# Patient Record
Sex: Female | Born: 1965 | Race: White | Hispanic: No | Smoking: Current every day smoker
Health system: Southern US, Community
[De-identification: ages and names within clinical notes are randomized; demographics above are authoritative.]

## PROBLEM LIST (undated history)

## (undated) DIAGNOSIS — F329 Major depressive disorder, single episode, unspecified: Secondary | ICD-10-CM

## (undated) DIAGNOSIS — J45909 Unspecified asthma, uncomplicated: Secondary | ICD-10-CM

## (undated) DIAGNOSIS — F32A Depression, unspecified: Secondary | ICD-10-CM

## (undated) DIAGNOSIS — F4310 Post-traumatic stress disorder, unspecified: Secondary | ICD-10-CM

## (undated) DIAGNOSIS — F419 Anxiety disorder, unspecified: Secondary | ICD-10-CM

## (undated) DIAGNOSIS — L409 Psoriasis, unspecified: Secondary | ICD-10-CM

## (undated) HISTORY — DX: Psoriasis, unspecified: L40.9

## (undated) HISTORY — PX: CHOLECYSTECTOMY: SHX55

## (undated) HISTORY — DX: Unspecified asthma, uncomplicated: J45.909

## (undated) HISTORY — DX: Depression, unspecified: F32.A

## (undated) HISTORY — DX: Major depressive disorder, single episode, unspecified: F32.9

## (undated) HISTORY — DX: Post-traumatic stress disorder, unspecified: F43.10

## (undated) HISTORY — DX: Anxiety disorder, unspecified: F41.9

---

## 2017-10-23 ENCOUNTER — Other Ambulatory Visit: Payer: Self-pay | Admitting: Physician Assistant

## 2017-10-23 DIAGNOSIS — M545 Low back pain: Secondary | ICD-10-CM

## 2017-10-28 ENCOUNTER — Ambulatory Visit
Admission: RE | Admit: 2017-10-28 | Discharge: 2017-10-28 | Disposition: A | Discharge status: Home / Self Care | Payer: BLUE CROSS/BLUE SHIELD | Source: Ambulatory Visit | Attending: Physician Assistant | Admitting: Physician Assistant

## 2017-10-28 DIAGNOSIS — M545 Low back pain: Secondary | ICD-10-CM

## 2018-03-28 ENCOUNTER — Ambulatory Visit (INDEPENDENT_AMBULATORY_CARE_PROVIDER_SITE_OTHER): Payer: Self-pay | Admitting: Obstetrics & Gynecology

## 2018-03-28 ENCOUNTER — Encounter: Payer: Self-pay | Admitting: Obstetrics & Gynecology

## 2018-03-28 ENCOUNTER — Other Ambulatory Visit (HOSPITAL_COMMUNITY)
Admission: RE | Admit: 2018-03-28 | Discharge: 2018-03-28 | Disposition: A | Discharge status: Home / Self Care | Payer: Self-pay | Source: Ambulatory Visit | Attending: Obstetrics & Gynecology | Admitting: Obstetrics & Gynecology

## 2018-03-28 ENCOUNTER — Telehealth: Payer: Self-pay | Admitting: Clinical

## 2018-03-28 DIAGNOSIS — R87810 Cervical high risk human papillomavirus (HPV) DNA test positive: Secondary | ICD-10-CM | POA: Insufficient documentation

## 2018-03-28 DIAGNOSIS — R87610 Atypical squamous cells of undetermined significance on cytologic smear of cervix (ASC-US): Secondary | ICD-10-CM | POA: Insufficient documentation

## 2018-03-28 DIAGNOSIS — N870 Mild cervical dysplasia: Secondary | ICD-10-CM

## 2018-03-28 LAB — POCT PREGNANCY, URINE: PREG TEST UR: NEGATIVE

## 2018-03-28 NOTE — Telephone Encounter (Signed)
Follow up call after positive PHQ9/GAD7 today. Pt says she has a therapist she sees for three hours per week. Pt denies SI, "I never wanted to hurt myself". Pt attributes positive scores to current life stress and PTSD stemming from ongoing childhood trauma. Pt says "Thank you so much for calling to check on me". Pt agrees she will continue to see her therapist on a routine basis.   Depression screen PHQ 2/9 03/28/2018  Decreased Interest 3  Down, Depressed, Hopeless 3  PHQ - 2 Score 6  Altered sleeping 3  Tired, decreased energy 3  Change in appetite 3  Feeling bad or failure about yourself  3  Trouble concentrating 3  Moving slowly or fidgety/restless 3  Suicidal thoughts 1  PHQ-9 Score 25   GAD 7 : Generalized Anxiety Score 03/28/2018  Nervous, Anxious, on Edge 3  Control/stop worrying 3  Worry too much - different things 3  Trouble relaxing 3  Restless 3  Easily annoyed or irritable 3  Afraid - awful might happen 3  Total GAD 7 Score 21

## 2018-03-28 NOTE — Progress Notes (Signed)
Patient ID: Julia Pittman, female   DOB: 1966-01-25, 53 y.o.   MRN: [id number]  Chief Complaint  Patient presents with  . Colposcopy    HPI Garland Clendenen is a 53 y.o. female.  [id number] Postmenopausal for 2 year HPI  Indications: Pap smear on November 2019 showed: ASCUS with POSITIVE high risk HPV. Previous colposcopy: unsure. Prior cervical treatment: no treatment.  Past Medical History:  Diagnosis Date  . Anxiety   . Asthma   . Depression   . Psoriasis   . PTSD (post-traumatic stress disorder)     Past Surgical History:  Procedure Laterality Date  . CHOLECYSTECTOMY      History reviewed. No pertinent family history.  Social History Social History   Tobacco Use  . Smoking status: Current Every Day Smoker    Types: Cigarettes  . Smokeless tobacco: Never Used  . Tobacco comment: 5 a day  Substance Use Topics  . Alcohol use: Not on file  . Drug use: Not on file    Not on File  Current Outpatient Medications  Medication Sig Dispense Refill  . CYCLOBENZAPRINE HCL PO Take 5 mg by mouth.    . diazepam (VALIUM) 5 MG tablet Take 5 mg by mouth every 6 (six) hours as needed for anxiety.    . diclofenac (VOLTAREN) 75 MG EC tablet Take 75 mg by mouth 2 (two) times daily.    . finasteride (PROSCAR) 5 MG tablet Take 5 mg by mouth daily.    . Fluocinolone Acetonide 0.01 % OIL Place in ear(s).    Marland Kitchen FLUoxetine (PROZAC) 40 MG capsule Take 40 mg by mouth daily.    Marland Kitchen gabapentin (NEURONTIN) 300 MG capsule Take 300 mg by mouth 3 (three) times daily.    . imiquimod (ALDARA) 5 % cream Apply topically 3 (three) times a week.    Marland Kitchen ketoconazole (NIZORAL) 2 % shampoo Apply 1 application topically 2 (two) times a week.    . mometasone (ELOCON) 0.1 % cream Apply 1 application topically daily.    Marland Kitchen terbinafine (LAMISIL) 250 MG tablet Take 250 mg by mouth daily.    . traMADol (ULTRAM) 50 MG tablet Take by mouth every 6 (six) hours as needed.     No current facility-administered medications  for this visit.     Review of Systems Review of Systems  Gastrointestinal: Positive for abdominal pain.  Genitourinary: Negative for menstrual problem, vaginal bleeding and vaginal discharge.    Blood pressure 130/80, pulse 76, weight 192 lb 14.4 oz (87.5 kg).  Physical Exam Physical Exam Constitutional:      Appearance: She is not ill-appearing.  Pulmonary:     Effort: Pulmonary effort is normal.  Abdominal:     General: There is no distension.  Genitourinary:    General: Normal vulva.     Vagina: No vaginal discharge.  Neurological:     Mental Status: She is alert.     Data Reviewed Pap result  Assessment    Procedure Details  The risks and benefits of the procedure and Written informed consent obtained.  Speculum placed in vagina and excellent visualization of cervix achieved, cervix swabbed x 3 with acetic acid solution. Cervix without gross lesion, minimal AWE at SCJ ant and post, no endocervical lesion,  Specimens: ECC and bx at 12 and 6, Monsel's applied  Complications: none.     Plan    Specimens labelled and sent to Pathology. Call to discuss Pathology results in 2 weeks. LSIL, repeat 12 month for  pap      Scheryl DarterJames  03/28/2018, 3:30 PM

## 2018-03-28 NOTE — Patient Instructions (Signed)
Colposcopy, Care After  This sheet gives you information about how to care for yourself after your procedure. Your doctor may also give you more specific instructions. If you have problems or questions, contact your doctor.  What can I expect after the procedure?  If you did not have a tissue sample removed (did not have a biopsy), you may only have some spotting for a few days. You can go back to your normal activities.  If you had a tissue sample removed, it is common to have:   Soreness and pain. This may last for a few days.   Light-headedness.   Mild bleeding from your vagina or dark-colored, grainy discharge from your vagina. This may last for a few days. You may need to wear a sanitary pad.   Spotting for at least 48 hours after the procedure.  Follow these instructions at home:     Take over-the-counter and prescription medicines only as told by your doctor. Ask your doctor what medicines you can start taking again. This is very important if you take blood-thinning medicine.   Do not drive or use heavy machinery while taking prescription pain medicine.   For 3 days, or as long as your doctor tells you, avoid:  ? Douching.  ? Using tampons.  ? Having sex.   If you use birth control (contraception), keep using it.   Limit activity for the first day after the procedure. Ask your doctor what activities are safe for you.   It is up to you to get the results of your procedure. Ask your doctor when your results will be ready.   Keep all follow-up visits as told by your doctor. This is important.  Contact a doctor if:   You get a skin rash.  Get help right away if:   You are bleeding a lot from your vagina. It is a lot of bleeding if you are using more than one pad an hour for 2 hours in a row.   You have clumps of blood (blood clots) coming from your vagina.   You have a fever.   You have chills   You have pain in your lower belly (pelvic area).   You have signs of infection, such as vaginal  discharge that is:  ? Different than usual.  ? Yellow.  ? Bad-smelling.   You have very pain or cramps in your lower belly that do not get better with medicine.   You feel light-headed.   You feel dizzy.   You pass out (faint).  Summary   If you did not have a tissue sample removed (did not have a biopsy), you may only have some spotting for a few days. You can go back to your normal activities.   If you had a tissue sample removed, it is common to have mild pain and spotting for 48 hours.   For 3 days, or as long as your doctor tells you, avoid douching, using tampons and having sex.   Get help right away if you have bleeding, very bad pain, or signs of infection.  This information is not intended to replace advice given to you by your health care provider. Make sure you discuss any questions you have with your health care provider.  Document Released: 07/19/2007 Document Revised: 10/20/2015 Document Reviewed: 10/20/2015  Elsevier Interactive Patient Education  2019 [employer]

## 2018-04-02 ENCOUNTER — Telehealth: Payer: Self-pay

## 2018-04-02 ENCOUNTER — Encounter: Payer: Self-pay | Admitting: *Deleted

## 2018-04-02 NOTE — Telephone Encounter (Signed)
Called pt to give test results for Colpo, pt states is still hurting, cramping. Is taking pain meds. Asked pt if still hurting by tomorrow to please come in Evening clinic between 4:30 -7:30p. Pt verbalized understanding.

## 2018-04-02 NOTE — Telephone Encounter (Signed)
-----   Message from Adam Phenix, MD sent at 04/01/2018  8:45 PM EST ----- LSIL pap, may repeat in 12 months

## 2019-07-24 DIAGNOSIS — R531 Weakness: Secondary | ICD-10-CM

## 2019-07-24 DIAGNOSIS — N179 Acute kidney failure, unspecified: Secondary | ICD-10-CM

## 2019-07-24 DIAGNOSIS — J9601 Acute respiratory failure with hypoxia: Secondary | ICD-10-CM

## 2019-07-30 DIAGNOSIS — R600 Localized edema: Secondary | ICD-10-CM

## 2019-10-01 ENCOUNTER — Encounter: Payer: Self-pay | Admitting: Sports Medicine

## 2019-10-01 ENCOUNTER — Other Ambulatory Visit: Payer: Self-pay

## 2019-10-01 ENCOUNTER — Ambulatory Visit: Payer: Medicaid Other | Admitting: Sports Medicine

## 2019-10-01 DIAGNOSIS — M2550 Pain in unspecified joint: Secondary | ICD-10-CM

## 2019-10-01 DIAGNOSIS — M792 Neuralgia and neuritis, unspecified: Secondary | ICD-10-CM | POA: Diagnosis not present

## 2019-10-01 DIAGNOSIS — I739 Peripheral vascular disease, unspecified: Secondary | ICD-10-CM | POA: Diagnosis not present

## 2019-10-01 DIAGNOSIS — M79671 Pain in right foot: Secondary | ICD-10-CM

## 2019-10-01 MED ORDER — PREDNISONE 10 MG (21) PO TBPK: ORAL_TABLET | ORAL | 0 refills | Status: DC

## 2019-10-01 MED ORDER — GABAPENTIN 300 MG PO CAPS: 300.0000 mg | ORAL_CAPSULE | Freq: Every day | ORAL | 1 refills | Status: DC

## 2019-10-01 NOTE — Progress Notes (Signed)
Subjective: Julia Pittman is a 54 y.o. female patient who presents to office for evaluation of spasming and numbness and tingling and swelling to both lower extremities reports that both equally hurts the same left slightly worse reports that she fell in the shower on June 10 and was hospitalized for a week and afterwards she still had pain in her legs and numbness to her toes especially the left middle toes as well as some pain to the lateral ankle that sharp shooting in nature.  Patient reports that she is also been experiencing swelling and soreness of the entire legs and shin.  Patient reports that self therapy in the water seems to help a little bit but otherwise no other relief with any other treatments.  Review of systems noncontributory.  Patient Active Problem List   Diagnosis Date Noted  . 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 CYCLOBENZAPRINE HCL PO Take 5 mg by mouth.    . diazepam (VALIUM) 5 MG tablet Take 5 mg by mouth every 6 (six) hours as needed for anxiety.    . diclofenac (VOLTAREN) 75 MG EC tablet Take 75 mg by mouth 2 (two) times daily.    . finasteride (PROSCAR) 5 MG tablet Take 5 mg by mouth daily.    . Fluocinolone Acetonide 0.01 % OIL Place in ear(s).    Marland Kitchen FLUoxetine (PROZAC) 40 MG capsule Take 40 mg by mouth daily.    . imiquimod (ALDARA) 5 % cream Apply topically 3 (three) times a week.    Marland Kitchen ketoconazole (NIZORAL) 2 % shampoo Apply 1 application topically 2 (two) times a week.    . mometasone (ELOCON) 0.1 % cream Apply 1 application topically daily.    Marland Kitchen terbinafine (LAMISIL) 250 MG tablet Take 250 mg by mouth daily.    . traMADol (ULTRAM) 50 MG tablet Take by mouth every 6 (six) hours as needed.     No current facility-administered medications on file prior to visit.    Not on File  Objective:  General: Alert and oriented x3 in no acute  distress  Dermatology: No open lesions bilateral lower extremities, no webspace macerations, no ecchymosis bilateral, all nails x 10 are well manicured.  Venous stasis hyperpigmentation noted bilateral.  Vascular: Dorsalis Pedis and Posterior Tibial pedal pulses palpable, Capillary Fill Time 5 seconds,(-) pedal hair growth bilateral, trace edema bilateral lower extremities, Temperature gradient within normal limits.  Neurology: Michaell Cowing sensation intact via light touch bilateral, subjective tingling and burning sensation to feet as well as to toes worse left middle toes 2 through 4  Musculoskeletal: Mild tenderness with palpation diffusely to both feet and legs entirely.  There is pain with range of motion bilateral and limited ankle joint dorsiflexion consistent with equinus.  Gait: Antalgic gait  Xrays reviewed from [facility] from 09/05/2019 suggestive of mild early bunion but no other acute findings.  Assessment and Plan: Problem List Items Addressed This Visit    None    Visit Diagnoses    PVD (peripheral vascular disease) (HCC)    -  Primary   Neuritis       Arthralgia, unspecified joint       Foot pain, bilateral           -Complete examination performed -Xrays reviewed from [facility] -Discussed treatement options likely PVD neuritis and arthralgia -Rx gabapentin and prednisone to take as instructed -Dispensed compression  Surgigrip sleeves to use for edema control bilateral -Recommend activities to tolerance and gentle home PT of water aerobics or stretching as tolerated -Patient to return to office in 1 month or sooner if condition worsens.  Advised patient if symptoms are still present may benefit from an arthritic panel.  Asencion Islam, DPM

## 2019-10-29 ENCOUNTER — Ambulatory Visit: Payer: Medicaid Other | Admitting: Sports Medicine

## 2019-11-07 ENCOUNTER — Encounter: Payer: Self-pay | Admitting: Sports Medicine

## 2019-11-07 ENCOUNTER — Other Ambulatory Visit: Payer: Self-pay

## 2019-11-07 ENCOUNTER — Ambulatory Visit: Payer: Medicaid Other | Admitting: Sports Medicine

## 2019-11-07 DIAGNOSIS — M2550 Pain in unspecified joint: Secondary | ICD-10-CM

## 2019-11-07 DIAGNOSIS — D72829 Elevated white blood cell count, unspecified: Secondary | ICD-10-CM | POA: Insufficient documentation

## 2019-11-07 DIAGNOSIS — M792 Neuralgia and neuritis, unspecified: Secondary | ICD-10-CM

## 2019-11-07 DIAGNOSIS — M6282 Rhabdomyolysis: Secondary | ICD-10-CM | POA: Insufficient documentation

## 2019-11-07 DIAGNOSIS — J309 Allergic rhinitis, unspecified: Secondary | ICD-10-CM | POA: Insufficient documentation

## 2019-11-07 DIAGNOSIS — R112 Nausea with vomiting, unspecified: Secondary | ICD-10-CM | POA: Insufficient documentation

## 2019-11-07 DIAGNOSIS — I739 Peripheral vascular disease, unspecified: Secondary | ICD-10-CM

## 2019-11-07 DIAGNOSIS — M79671 Pain in right foot: Secondary | ICD-10-CM

## 2019-11-07 MED ORDER — GABAPENTIN 300 MG PO CAPS: 300.0000 mg | ORAL_CAPSULE | Freq: Two times a day (BID) | ORAL | 3 refills | Status: AC

## 2019-11-07 NOTE — Progress Notes (Signed)
Subjective: Julia Pittman is a 54 y.o. female patient returns to office for follow-up evaluation of bilateral foot pain.  Patient reports that the swelling is not as bad since she has been using the compression sleeve that we provided and taking a fluid medication but she still has sharp shooting pains in both feet and legs through the toes and arch patient reports that she went to pain management and will be going back on September 29 for a shot in her back patient reports that the prednisone did not make a difference with her pain and the gabapentin is difficult to tell if it is helping still has significant pain worse at bedtime sharp shooting and tingling to both feet.  Patient reports that her pain doctor started her on hydrocodone as well and wanted to let me know.  Patient denies any other pedal complaints at this time.  Patient Active Problem List   Diagnosis Date Noted  . Allergic rhinitis 11/07/2019  . 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 Leukocytosis 11/07/2019  . Nausea and vomiting 11/07/2019  . 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 CYCLOBENZAPRINE HCL PO Take 5 mg by mouth.    . diazepam (VALIUM) 5 MG tablet Take 5 mg by mouth every 6 (six) hours as needed for anxiety.    . diclofenac (VOLTAREN) 75 MG EC tablet Take 75 mg by mouth 2 (two) times daily.    . finasteride (PROSCAR) 5 MG tablet Take 5 mg by mouth daily.    . Fluocinolone Acetonide 0.01 % OIL Place in ear(s).    Marland Kitchen FLUoxetine (PROZAC) 40 MG capsule Take 40 mg by mouth daily.    . imiquimod (ALDARA) 5 % cream Apply topically 3 (three) times a week.    Marland Kitchen ketoconazole (NIZORAL) 2 % shampoo Apply 1 application topically 2 (two) times a week.    . mometasone (ELOCON) 0.1 % cream Apply 1 application topically daily.    . predniSONE (STERAPRED UNI-PAK 21 TAB) 10 MG (21) TBPK tablet Take  as directed 21 tablet 0  . terbinafine (LAMISIL) 250 MG tablet Take 250 mg by mouth daily.    . traMADol (ULTRAM) 50 MG tablet Take by mouth every 6 (six) hours as needed.     No current facility-administered medications on file prior to visit.    Not on File  Objective:  General: Alert and oriented x3 in no acute distress  Dermatology: No open lesions bilateral lower extremities, no webspace macerations, no ecchymosis bilateral, all nails x 10 are well manicured.  Venous stasis hyperpigmentation noted bilateral.  Vascular: Dorsalis Pedis and Posterior Tibial pedal pulses palpable, Capillary Fill Time 5 seconds,(-) pedal hair growth bilateral, trace edema bilateral lower extremities, Temperature gradient within normal limits.  Neurology: Gross sensation intact via light touch bilateral, subjective tingling and burning sensation to feet as well as to toes worse left middle toes 2 through 4 and bilateral arches.  Musculoskeletal: Patient is hypersensitive to light touch bilateral with guarded range of motion due to increased sharp shooting pain bilateral.   Assessment and Plan: Problem List Items Addressed This Visit    None    Visit Diagnoses    Neuritis    -  Primary   PVD (peripheral vascular disease) (HCC)       Arthralgia, unspecified joint       Foot pain, bilateral           -  Complete examination performed -Rediscussed with patient treatment options for neuritis likely related to back issues -Discussed with patient likely need to increase gabapentin to twice daily however I will also discuss this change of medication with her pain management doctor patient sees Selena Batten at [facility] I have called and left a message at this phone number of [phone] informing Selena Batten of the changing gabapentin -Continue with compression Surgigrip sleeves to use for edema control bilateral -Recommend activities to tolerance and sketchers arch feet or vionic shoes to provide additional  foot support -Patient to return to office as scheduled if pain continues to worsen may benefit from an arthritic panel.  Asencion Islam, DPM

## 2020-03-14 IMAGING — MR MR LUMBAR SPINE W/O CM
4 of 5 series · 18 of 48 positions shown · non-contrast
Comparison: None.

CLINICAL DATA: Fall 2 weeks ago with bilateral low back pain

EXAM:
MRI LUMBAR SPINE WITHOUT CONTRAST
TECHNIQUE: Multiplanar, multisequence MR imaging of the lumbar spine was
performed. No intravenous contrast was administered.

[Series 6: T2 · sagittal · 4.0mm · 0.73mm/px · 6 of 15 slices shown (1 of 2)]
[im 1/15]
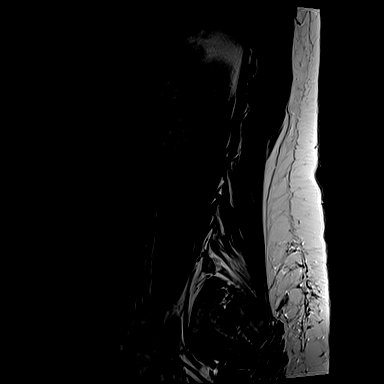
[im 3/15]
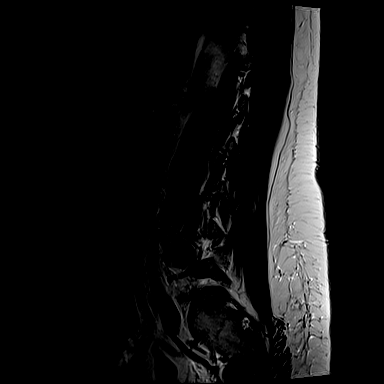
[im 6/15]
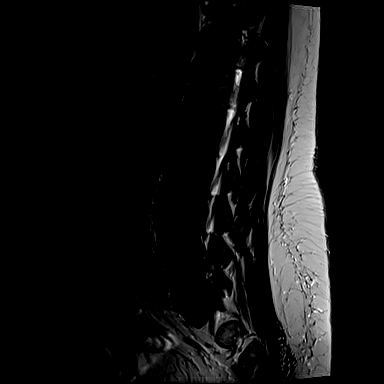
[im 9/15]
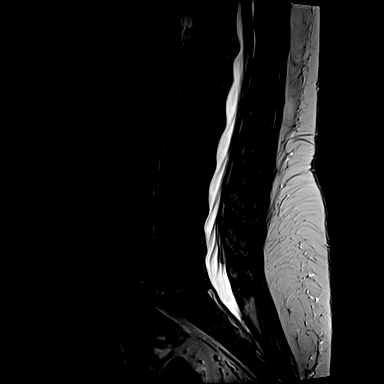
[im 12/15]
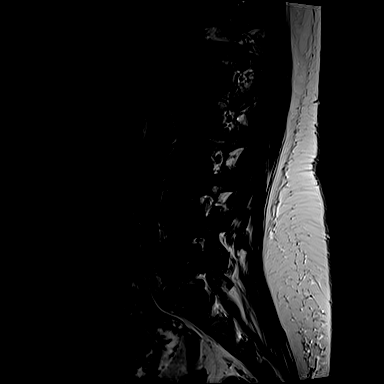
[im 15/15]
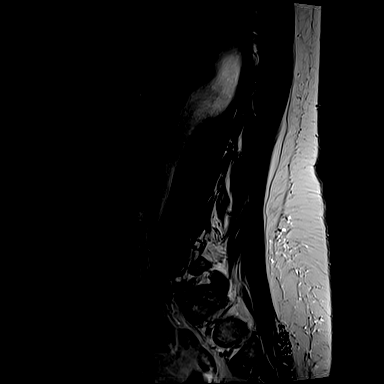

[Series 7: T1 · sagittal · 4.0mm · 0.73mm/px · 3 of 15 slices shown (1 of 2)]
[im 1/15]
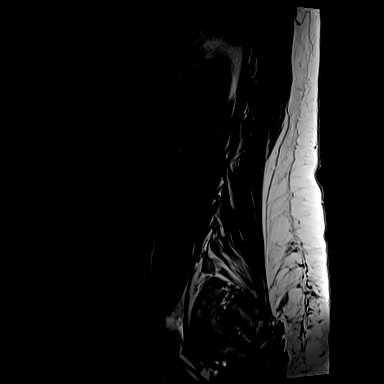
[im 8/15]
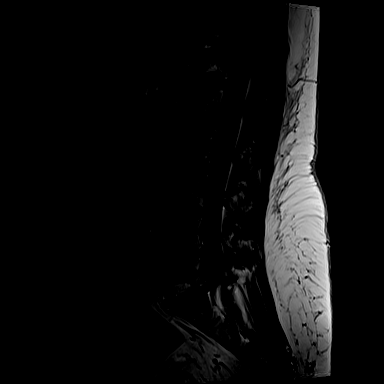
[im 15/15]
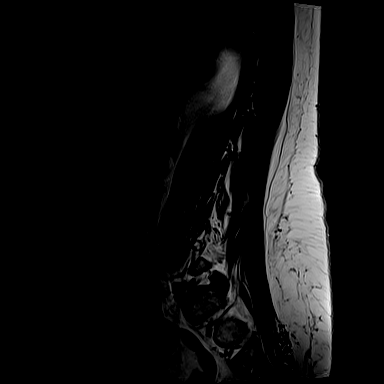

[Series 11: T1 · axial · 4.0mm · 0.28mm/px · z∈[-18,+153]mm · 3 of 43 slices shown (2 of 2)]
[im 6/43]
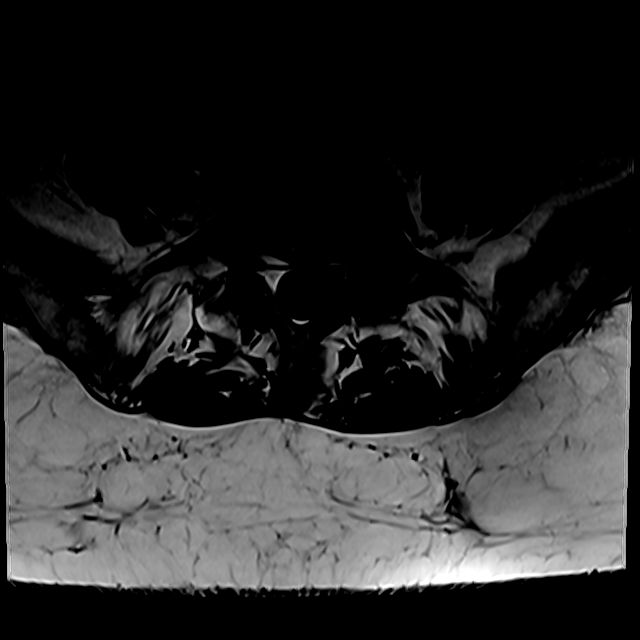
[im 23/43]
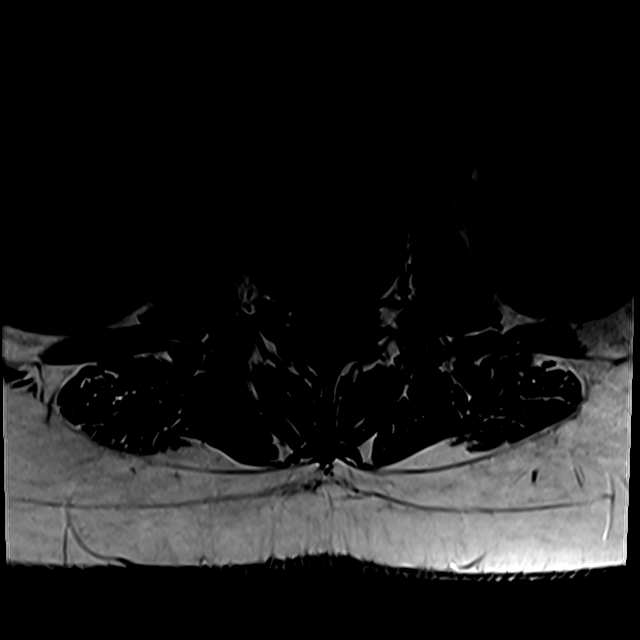
[im 37/43]
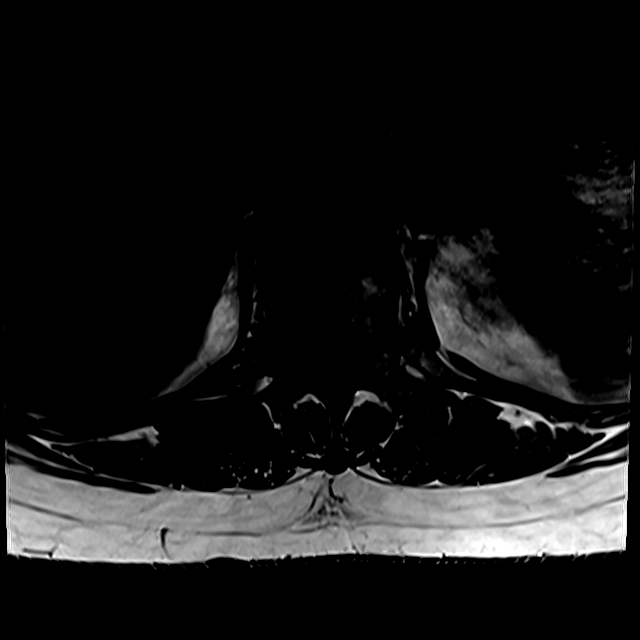

[Series 14: T2 · axial · 4.0mm · 0.28mm/px · z∈[-33,+153]mm · 6 of 43 slices shown (2 of 2)]
[im 3/43]
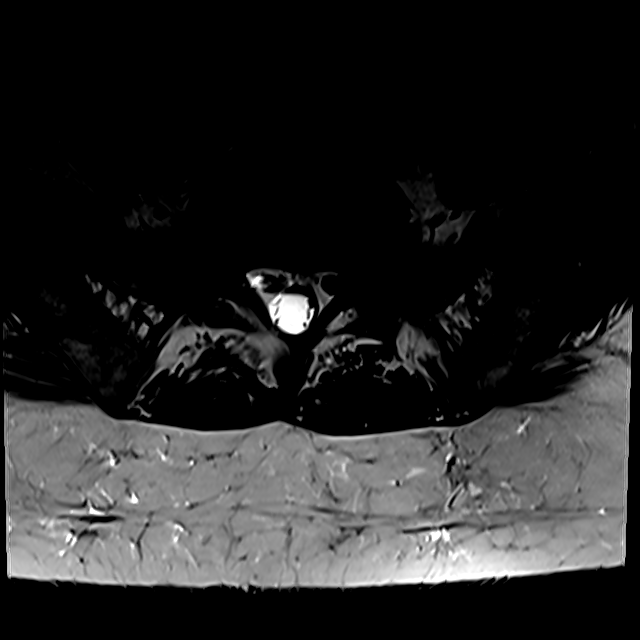
[im 6/43]
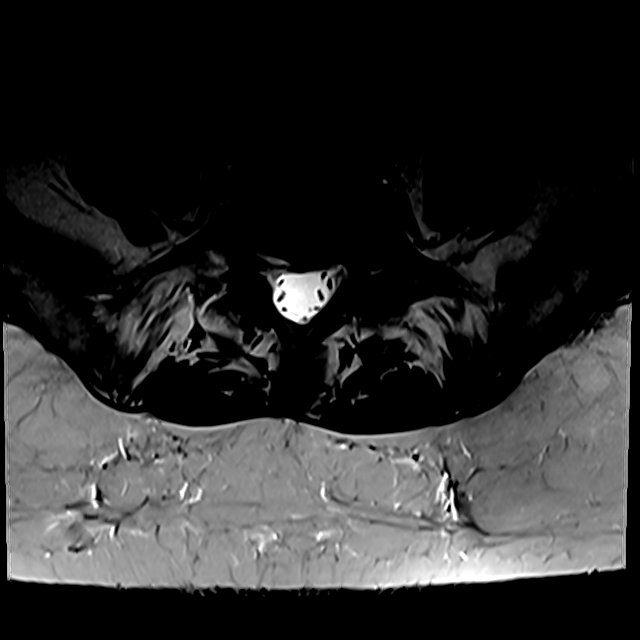
[im 9/43]
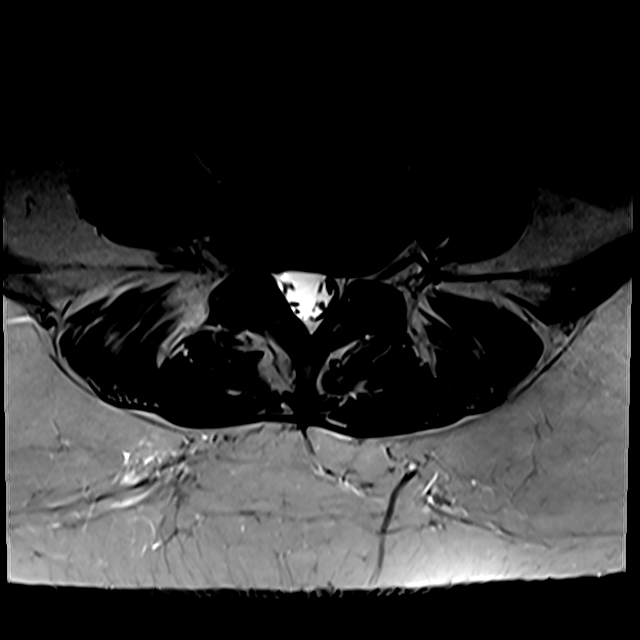
[im 15/43]
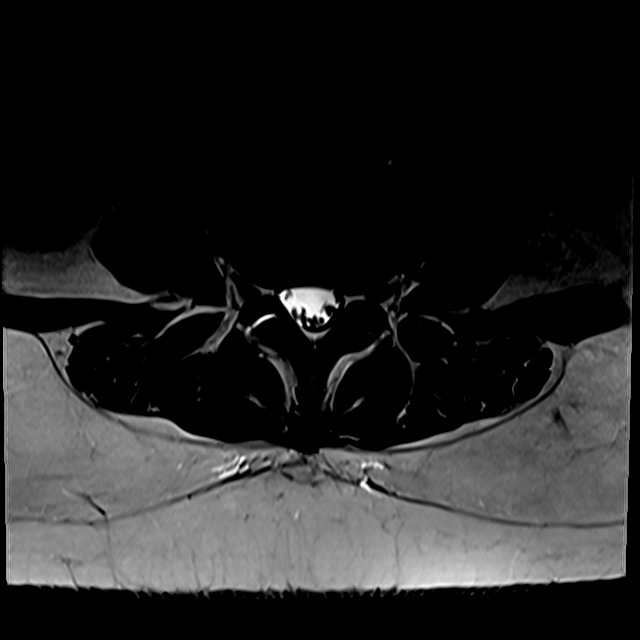
[im 23/43]
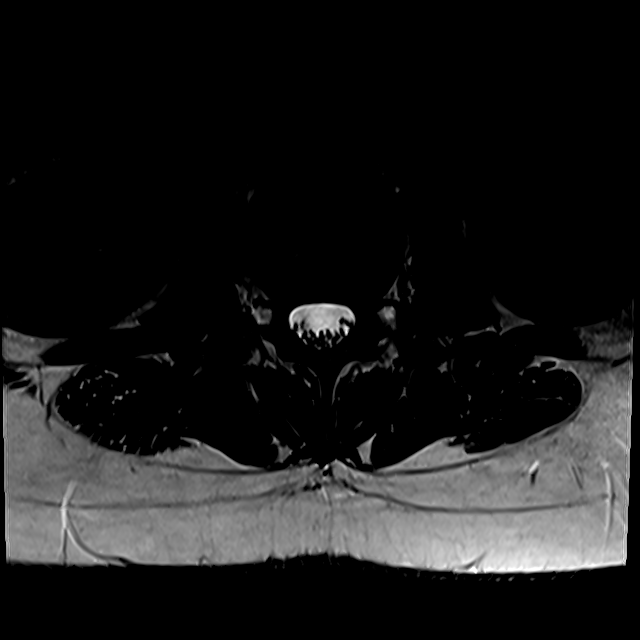
[im 37/43]
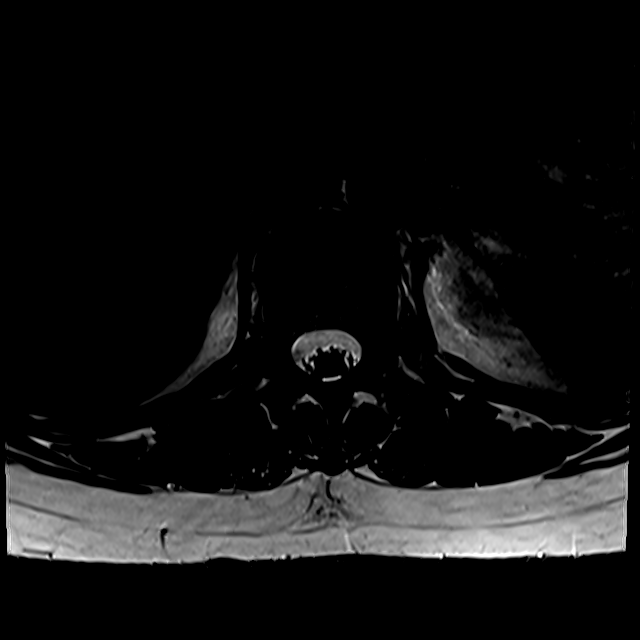

[18 of 48 positions shown; findings below may reference images not displayed]

FINDINGS: Segmentation:  Standard based on the available coverage

Alignment:  No traumatic malalignment

Vertebrae: Marrow edema in the mildly depressed T11 and T12 bodies.
Minimal retropulsion at T12 without impingement. No evidence of
posterior element injury. Mild paravertebral edema.

Conus medullaris and cauda equina: Conus extends to the L1 level.
Conus and cauda equina appear normal.

Paraspinal and other soft tissues: Paravertebral edema at levels of
fracture.

Disc levels:

L3-L4: Mild disc narrowing and bulging.

L4-L5: Disc bulging with mild narrowing. Mild borderline moderate
right foraminal narrowing.

L5-S1:Advanced disc narrowing asymmetric to the left where there is
also facet spurring. Left L5 foraminal impingement. Patent canal

These results will be called to the ordering clinician or
representative by the Radiologist Assistant, and communication
documented in the PACS or zVision Dashboard.
IMPRESSION: 1. T11 and T12 acute compression fractures with mild height loss.
Minimal retropulsion at T12 without impingement.
2. Notable disc degeneration at L5-S1 with left foraminal
impingement.

## 2020-05-05 ENCOUNTER — Ambulatory Visit: Payer: Medicaid Other | Admitting: Sports Medicine

## 2020-06-11 ENCOUNTER — Encounter: Payer: Self-pay | Admitting: Sports Medicine

## 2020-06-11 ENCOUNTER — Ambulatory Visit: Payer: Medicaid Other | Admitting: Sports Medicine

## 2020-06-11 ENCOUNTER — Other Ambulatory Visit: Payer: Self-pay

## 2020-06-11 DIAGNOSIS — L603 Nail dystrophy: Secondary | ICD-10-CM | POA: Diagnosis not present

## 2020-06-11 DIAGNOSIS — M79674 Pain in right toe(s): Secondary | ICD-10-CM

## 2020-06-11 DIAGNOSIS — M79675 Pain in left toe(s): Secondary | ICD-10-CM

## 2020-06-11 NOTE — Progress Notes (Signed)
Subjective: Julia Pittman is a 55 y.o. female patient seen today in office with complaint of mildly painful thickened and discolored nails. Patient is desiring treatment for nail changes; has not tried any treatment. Reports that nails are becoming difficult to manage because they are lifting off a the ends on the big toes. Patient has no other pedal complaints at this time.   Patient Active Problem List   Diagnosis Date Noted  . Allergic rhinitis 11/07/2019  . 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 Leukocytosis 11/07/2019  . Nausea and vomiting 11/07/2019  . 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 CYCLOBENZAPRINE HCL PO Take 5 mg by mouth.    . diazepam (VALIUM) 5 MG tablet Take 5 mg by mouth every 6 (six) hours as needed for anxiety.    . diclofenac (VOLTAREN) 75 MG EC tablet Take 75 mg by mouth 2 (two) times daily.    . finasteride (PROSCAR) 5 MG tablet Take 5 mg by mouth daily.    . Fluocinolone Acetonide 0.01 % OIL Place in ear(s).    Marland Kitchen FLUoxetine (PROZAC) 40 MG capsule Take 40 mg by mouth daily.    Marland Kitchen gabapentin (NEURONTIN) 300 MG capsule Take 1 capsule (300 mg total) by mouth 2 (two) times daily. 90 capsule 3  . imiquimod (ALDARA) 5 % cream Apply topically 3 (three) times a week.    Marland Kitchen ketoconazole (NIZORAL) 2 % shampoo Apply 1 application topically 2 (two) times a week.    . mometasone (ELOCON) 0.1 % cream Apply 1 application topically daily.    . predniSONE (STERAPRED UNI-PAK 21 TAB) 10 MG (21) TBPK tablet Take as directed 21 tablet 0  . terbinafine (LAMISIL) 250 MG tablet Take 250 mg by mouth daily.    . traMADol (ULTRAM) 50 MG tablet Take by mouth every 6 (six) hours as needed.     No current facility-administered medications on file prior to visit.    Not on File  Objective: Physical Exam  General: Well developed, nourished, no acute  distress, awake, alert and oriented x 3  Vascular: Dorsalis pedis artery 1/4 bilateral, Posterior tibial artery 1/4 bilateral, skin temperature warm to warm proximal to distal bilateral lower extremities, mild varicosities, scant pedal hair present bilateral.  Neurological: Gross sensation present via light touch bilateral.   Dermatological: Skin is warm, dry, and supple bilateral, Bilateral hallux nails are short thick, and discolored with mild subungal debris with distal lifting, no webspace macerations present bilateral, no open lesions present bilateral, no callus/corns/hyperkeratotic tissue present bilateral. No signs of infection bilateral.  Musculoskeletal: +Hammertoe boney deformities noted bilateral. Muscular strength within normal limits without painon range of motion. No pain with calf compression bilateral.  Assessment and Plan:  Problem List Items Addressed This Visit   None   Visit Diagnoses    Nail dystrophy    -  Primary   Relevant Orders   Culture, fungus without smear   Toe pain, bilateral          -Examined patient -Discussed treatment options for painful dystrophic nails  -Fungal culture was obtained by removing a portion of the hard nail itself from each of the involved toenails using a sterile nail nipper and sent to [facility] lab. Patient tolerated the biopsy procedure well without discomfort or need for anesthesia.  -Patient to return in 4 weeks for follow up evaluation and  discussion of fungal culture results or sooner if symptoms worsen.  Asencion Islam, DPM

## 2020-07-13 ENCOUNTER — Encounter: Payer: Self-pay | Admitting: Sports Medicine

## 2020-07-13 ENCOUNTER — Other Ambulatory Visit: Payer: Self-pay

## 2020-07-13 ENCOUNTER — Ambulatory Visit: Payer: Medicaid Other | Admitting: Sports Medicine

## 2020-07-13 DIAGNOSIS — M79674 Pain in right toe(s): Secondary | ICD-10-CM

## 2020-07-13 DIAGNOSIS — L603 Nail dystrophy: Secondary | ICD-10-CM | POA: Diagnosis not present

## 2020-07-13 DIAGNOSIS — M79675 Pain in left toe(s): Secondary | ICD-10-CM | POA: Diagnosis not present

## 2020-07-13 NOTE — Progress Notes (Signed)
Subjective: Julia Pittman is a 55 y.o. female patient seen today in office for fungal culture results. Reports that she cut her right leg and has 32 stitches. Patient has no other pedal complaints at this time.   Patient Active Problem List   Diagnosis Date Noted  . Allergic rhinitis 11/07/2019  . 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 Leukocytosis 11/07/2019  . Nausea and vomiting 11/07/2019  . 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 CYCLOBENZAPRINE HCL PO Take 5 mg by mouth.    . diazepam (VALIUM) 5 MG tablet Take 5 mg by mouth every 6 (six) hours as needed for anxiety.    . diclofenac (VOLTAREN) 75 MG EC tablet Take 75 mg by mouth 2 (two) times daily.    . finasteride (PROSCAR) 5 MG tablet Take 5 mg by mouth daily.    . Fluocinolone Acetonide 0.01 % OIL Place in ear(s).    Marland Kitchen FLUoxetine (PROZAC) 40 MG capsule Take 40 mg by mouth daily.    Marland Kitchen gabapentin (NEURONTIN) 300 MG capsule Take 1 capsule (300 mg total) by mouth 2 (two) times daily. 90 capsule 3  . imiquimod (ALDARA) 5 % cream Apply topically 3 (three) times a week.    Marland Kitchen ketoconazole (NIZORAL) 2 % shampoo Apply 1 application topically 2 (two) times a week.    . mometasone (ELOCON) 0.1 % cream Apply 1 application topically daily.    . predniSONE (STERAPRED UNI-PAK 21 TAB) 10 MG (21) TBPK tablet Take as directed 21 tablet 0  . terbinafine (LAMISIL) 250 MG tablet Take 250 mg by mouth daily.    . traMADol (ULTRAM) 50 MG tablet Take by mouth every 6 (six) hours as needed.     No current facility-administered medications on file prior to visit.    Not on File  Objective: Physical Exam  General: Well developed, nourished, no acute distress, awake, alert and oriented x 3  Vascular: Dorsalis pedis artery 1/4 bilateral, Posterior tibial artery 1/4 bilateral, skin temperature warm to warm proximal  to distal bilateral lower extremities, mild varicosities, scant pedal hair present bilateral.Wrap on right leg from previous injury being treated by a different provider.    Neurological: Gross sensation present via light touch bilateral.    Dermatological: Skin is warm, dry, and supple bilateral, Bilateral hallux nails are short thick, and discolored with mild subungal debris with distal lifting, no webspace macerations present bilateral, no open lesions present bilateral, no callus/corns/hyperkeratotic tissue present bilateral. No signs of infection bilateral.   Musculoskeletal: +Hammertoe boney deformities noted bilateral. Muscular strength within normal limits without painon range of motion. No pain with calf compression bilateral.  Fungal culture- Negative  Assessment and Plan:  Problem List Items Addressed This Visit   None   Visit Diagnoses    Nail dystrophy    -  Primary   Toe pain, bilateral          -Examined patient -Discussed treatment options for dystrophic nails  -Patient opt for urea topical; Rx faxed to [facility]  -Advised good supportive shoes that do not crowd toes -Patient to return PRN or sooner if symptoms worsen.  Asencion Islam, DPM

## 2020-07-16 ENCOUNTER — Telehealth: Payer: Self-pay | Admitting: Sports Medicine

## 2020-07-16 ENCOUNTER — Telehealth: Payer: Self-pay | Admitting: *Deleted

## 2020-07-16 NOTE — Telephone Encounter (Signed)
Called and left a message for the patient and relayed the message per Dr Stover. Lorn Butcher ?

## 2020-07-16 NOTE — Telephone Encounter (Signed)
Pt requesting an alternative medication due to insurance not covering it. Please advise.

## 2020-07-16 NOTE — Telephone Encounter (Signed)
-----   Message from Asencion Islam, [place] sent at 07/15/2020  5:39 PM EDT ----- If she can not afford the topical for her nails from [facility] then she can purchase the toecylen from our office which is about $57 or she can use OTC Eucerin cream which has urea in it. Put a small amount to the nails at bedtime. She can file her nails prior to putting on the cream for a better result Dr. Kathie Rhodes ----- Message ----- From: Lanney Gins, [facility] Sent: 07/15/2020   4:29 PM EDT To: Asencion Islam, DPM  Hey patient called and stated that the ointment was $60.00 dollars and medicaid is not going to pay for it. Misty Stanley

## 2020-07-28 DIAGNOSIS — S85141A Laceration of anterior tibial artery, right leg, initial encounter: Secondary | ICD-10-CM | POA: Insufficient documentation

## 2020-10-20 ENCOUNTER — Ambulatory Visit: Payer: Medicaid Other | Admitting: Sports Medicine

## 2020-11-17 ENCOUNTER — Ambulatory Visit (INDEPENDENT_AMBULATORY_CARE_PROVIDER_SITE_OTHER): Payer: Medicare Other | Admitting: Sports Medicine

## 2020-11-17 ENCOUNTER — Ambulatory Visit (INDEPENDENT_AMBULATORY_CARE_PROVIDER_SITE_OTHER): Payer: Medicare Other

## 2020-11-17 ENCOUNTER — Other Ambulatory Visit: Payer: Self-pay

## 2020-11-17 ENCOUNTER — Encounter: Payer: Self-pay | Admitting: Sports Medicine

## 2020-11-17 DIAGNOSIS — M7750 Other enthesopathy of unspecified foot: Secondary | ICD-10-CM

## 2020-11-17 DIAGNOSIS — M779 Enthesopathy, unspecified: Secondary | ICD-10-CM

## 2020-11-17 DIAGNOSIS — M79604 Pain in right leg: Secondary | ICD-10-CM

## 2020-11-17 DIAGNOSIS — M79675 Pain in left toe(s): Secondary | ICD-10-CM

## 2020-11-17 DIAGNOSIS — L603 Nail dystrophy: Secondary | ICD-10-CM

## 2020-11-17 DIAGNOSIS — M79671 Pain in right foot: Secondary | ICD-10-CM

## 2020-11-17 DIAGNOSIS — M79674 Pain in right toe(s): Secondary | ICD-10-CM

## 2020-11-17 MED ORDER — PREDNISONE 10 MG (21) PO TBPK: ORAL_TABLET | ORAL | 0 refills | Status: AC

## 2020-11-17 NOTE — Progress Notes (Signed)
Subjective: Julia Pittman is a 55 y.o. female patient seen today in office for nail check and trim.  Patient also reports that she is having increased pain and swelling on the right lower leg states that she had a cut to the anterior shin that took a really long time to heal but now it seems like it is finally healed recently over the last week or 2 has had increased swelling over the entire lower extremity with pain to the previously open wound as well as now pain when she walks and bends the ankle and pain that radiates to the dorsal lateral foot.  Patient denies any known injury or trauma but states that this pain has slowly worsened states that she is always had pain in this leg from the cut but states that the pain in the foot and ankle is new.  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     buPROPion (WELLBUTRIN SR) 150 MG 12 hr tablet Take by mouth.     buPROPion (ZYBAN) 150 MG 12 hr tablet 150 mg.     clotrimazole-betamethasone (LOTRISONE) cream clotrimazole-betamethasone 1 %-0.05 % topical cream     dicyclomine (BENTYL) 20 MG tablet dicyclomine 20 mg tablet     famotidine (PEPCID) 20 MG tablet Take by mouth.     furosemide (LASIX) 20 MG tablet Take by mouth.     gabapentin (NEURONTIN) 300 MG capsule Take by mouth.     HYDROcodone-acetaminophen (NORCO/VICODIN) 5-325 MG tablet hydrocodone 5 mg-acetaminophen 325 mg tablet     hydrOXYzine  (ATARAX/VISTARIL) 25 MG tablet Take by mouth.     ibuprofen (ADVIL) 800 MG tablet Take by mouth.     naproxen (NAPROSYN) 500 MG tablet naproxen 500 mg tablet     nystatin cream (MYCOSTATIN) APPLY CREAM TOPICALLY TO AFFECTED AREAS TWICE DAILY FOR 14 DAYS     traMADol (ULTRAM) 50 MG tablet Take by mouth.     triamcinolone cream (KENALOG) 0.1 % APPLY CREAM EXTERNALLY TO AFFECTED AREA TWICE DAILY FOR 2 WEEKS     ALPRAZolam (XANAX) 0.25 MG tablet alprazolam 0.25 mg tablet  TAKE 1 TABLET BY MOUTH TWICE DAILY AS NEEDED     amoxicillin-clavulanate (AUGMENTIN) 875-125 MG tablet Augmentin 875 mg-125 mg tablet  Take 1 tablet every 12 hours by oral route with meals for 14 days.     aspirin 81 MG EC tablet Take by mouth.     azithromycin (ZITHROMAX) 250 MG tablet azithromycin 250 mg tablet     Biotin 1000 MCG tablet Take by mouth.     Carbinoxamine Maleate 6 MG TABS carbinoxamine 6 mg tablet     cefdinir (OMNICEF) 300 MG capsule cefdinir 300 mg capsule  TAKE 1 CAPSULE BY MOUTH EVERY 12 HOURS WITH MEALS FOR 10 DAYS     cyclobenzaprine (FLEXERIL) 5 MG tablet cyclobenzaprine 5 mg tablet     CYCLOBENZAPRINE HCL PO Take 5 mg by mouth.     dexamethasone (  DECADRON) 0.1 % ophthalmic solution dexamethasone sodium phosphate 0.1 % eye drops     diazepam (VALIUM) 5 MG tablet Take 5 mg by mouth every 6 (six) hours as needed for anxiety.     diazepam (VALIUM) 5 MG tablet diazepam 5 mg tablet     diclofenac (VOLTAREN) 75 MG EC tablet Take 75 mg by mouth 2 (two) times daily.     diclofenac (VOLTAREN) 75 MG EC tablet diclofenac sodium 75 mg tablet,delayed release     DULERA 200-5 MCG/ACT AERO Inhale 2 puffs into the lungs 2 (two) times daily.     finasteride (PROSCAR) 5 MG tablet Take 5 mg by mouth daily.     finasteride (PROSCAR) 5 MG tablet finasteride 5 mg tablet     fluconazole (DIFLUCAN) 150 MG tablet fluconazole 150 mg tablet     Fluocinolone Acetonide 0.01 % OIL Place in ear(s).     Fluocinolone Acetonide Body  0.01 % OIL Derma-Smoothe/FS Scalp Oil 0.01 %     FLUoxetine (PROZAC) 40 MG capsule Take 40 mg by mouth daily.     FLUoxetine (PROZAC) 40 MG capsule Prozac 40 mg capsule  Take 1 capsule every day by oral route.     fluticasone (FLONASE) 50 MCG/ACT nasal spray Place into the nose.     fluticasone (FLOVENT HFA) 44 MCG/ACT inhaler Flovent HFA 44 mcg/actuation aerosol inhaler  INHALE 2 PUFFS BY MOUTH TWICE DAILY     gabapentin (NEURONTIN) 300 MG capsule Take 1 capsule (300 mg total) by mouth 2 (two) times daily. 90 capsule 3   GAVILYTE-G 236 g solution Take 4,000 mLs by mouth as directed.     imiquimod (ALDARA) 5 % cream Apply topically 3 (three) times a week.     imiquimod (ALDARA) 5 % cream imiquimod 5 % topical cream packet     influenza vac split quadrivalent PF (FLUARIX) 0.5 ML injection Fluzone Quad 2018-19(PF) 60 mcg(15 mcgx4)/0.5 mL intramuscular syringe     ketoconazole (NIZORAL) 2 % shampoo Apply 1 application topically 2 (two) times a week.     ketoconazole (NIZORAL) 2 % shampoo ketoconazole 2 % shampoo  WASH HAIR WITH SHAMPOO THREE TIMES A WEEK     meloxicam (MOBIC) 7.5 MG tablet meloxicam 7.5 mg tablet     mometasone (ELOCON) 0.1 % cream Apply 1 application topically daily.     mometasone (ELOCON) 0.1 % cream mometasone 0.1 % topical cream     montelukast (SINGULAIR) 10 MG tablet montelukast 10 mg tablet     mupirocin ointment (BACTROBAN) 2 % Apply topically.     naloxone (NARCAN) nasal spray 4 mg/0.1 mL Narcan 4 mg/actuation nasal spray  ADMINISTER A SINGLE SPRAY IN ONE NOSTRIL UPON SIGNS OF OPIOID OVERDOSE. CALL 911. REPEAT AFTER 3 MINUTES IF NO RESPONSE.     nicotine (NICODERM CQ - DOSED IN MG/24 HR) 7 mg/24hr patch daily.     ofloxacin (FLOXIN) 0.3 % OTIC solution ofloxacin 0.3 % ear drops  INSTILL 5 DROPS INTO AFFECTED EAR(S) TWICE DAILY FOR UP TO 7 DAYS     pantoprazole (PROTONIX) 40 MG tablet Take 40 mg by mouth daily.     pneumococcal 23 valent vaccine (PNEUMOVAX 23) 25  MCG/0.5ML injection Pneumovax-23 25 mcg/0.5 mL injection syringe     polyethylene glycol powder (GAVILAX) 17 GM/SCOOP powder Gavilax 17 gram/dose oral powder  MIX 17 GRAMS OF POWDER IN 8 OUNCES OF LIQUID AND DRINK ONCE DAILY     promethazine (PHENERGAN) 25 MG tablet promethazine 25 mg  tablet     sulfamethoxazole-trimethoprim (BACTRIM DS) 800-160 MG tablet sulfamethoxazole 800 mg-trimethoprim 160 mg tablet     tamsulosin (FLOMAX) 0.4 MG CAPS capsule Flomax 0.4 mg capsule  Take 1 capsule every day by oral route.     terbinafine (LAMISIL) 250 MG tablet Take 250 mg by mouth daily.     traMADol (ULTRAM) 50 MG tablet Take by mouth every 6 (six) hours as needed.     traZODone (DESYREL) 50 MG tablet trazodone 50 mg tablet  1 tablet at bedtime as needed for sleep.     No current facility-administered medications on file prior to visit.    Not on File  Objective: Physical Exam  General: Well developed, nourished, no acute distress, awake, alert and oriented x 3  Vascular: Dorsalis pedis artery 1/4 bilateral, Posterior tibial artery 1/4 bilateral, skin temperature warm to warm proximal to distal bilateral lower extremities, mild varicosities, scant pedal hair present bilateral.Right anterior leg/shin wound is completely healed with reactive scar   Neurological: Gross sensation present via light touch bilateral.    Dermatological: Skin is warm, dry, and supple bilateral, Bilateral hallux nails are short thick, and discolored with mild subungal debris with distal lifting, no webspace macerations present bilateral, no open lesions present bilateral, no callus/corns/hyperkeratotic tissue present bilateral. No signs of infection bilateral.   Musculoskeletal: Pain to palpation to right anterior shin, there is also pain to palpation to right ankle and dorsal lateral right foot.  +Hammertoe boney deformities noted bilateral. Muscular strength within normal limits but guarding on the right due to  pain.   Assessment and Plan:  Problem List Items Addressed This Visit   None Visit Diagnoses     Tendonitis of ankle or foot    -  Primary   Relevant Orders   DG Foot 2 Views Right (Completed)   DG Ankle Complete Right (Completed)   Capsulitis       Relevant Orders   DG Foot 2 Views Right (Completed)   DG Ankle Complete Right (Completed)   Right foot pain       Right leg pain       Nail dystrophy       Toe pain, bilateral           -Examined patient -Discussed treatment options for dystrophic nails  -Advised patient to continue with topical urea to the nails with use of Eucerin cream since she could not afford the topical from [facility] -X-rays reviewed of the right foot and ankle with no acute osseous findings -Advised patient to rest ice elevate and to use Surgigrip compression sleeve for pain and edema control -Prescribed prednisone for patient to take as directed for likely tendinitis versus capsulitis of the right foot and ankle -Continue with cane for stability and gait -Patient to return if symptoms fail to improve after she has completed steroid pack or sooner if symptoms worsen.  Asencion Islam, DPM
